# Patient Record
Sex: Male | Born: 2000
Health system: Southern US, Community
[De-identification: ages and names within clinical notes are randomized; demographics above are authoritative.]

## PROBLEM LIST (undated history)

## (undated) DIAGNOSIS — E119 Type 2 diabetes mellitus without complications: Secondary | ICD-10-CM

## (undated) HISTORY — DX: Type 2 diabetes mellitus without complications: E11.9

---
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
## 2022-07-19 ENCOUNTER — Telehealth: Payer: Self-pay

## 2022-07-19 NOTE — Telephone Encounter (Signed)
LVM for patient to call back. AS, CMA 

## 2024-04-30 ENCOUNTER — Ambulatory Visit: Admitting: Family Medicine
